# Patient Record
Sex: Female | Born: 1975 | Race: White | Hispanic: No | Marital: Married | Smoking: Former smoker
Health system: Southern US, Community
[De-identification: ages and names within clinical notes are randomized; demographics above are authoritative.]

## PROBLEM LIST (undated history)

## (undated) DIAGNOSIS — F4322 Adjustment disorder with anxiety: Secondary | ICD-10-CM

## (undated) DIAGNOSIS — G44009 Cluster headache syndrome, unspecified, not intractable: Secondary | ICD-10-CM

## (undated) DIAGNOSIS — F410 Panic disorder [episodic paroxysmal anxiety] without agoraphobia: Secondary | ICD-10-CM

## (undated) HISTORY — DX: Adjustment disorder with anxiety: F43.22

## (undated) HISTORY — DX: Panic disorder (episodic paroxysmal anxiety): F41.0

## (undated) HISTORY — PX: FOOT SURGERY: SHX648

## (undated) HISTORY — PX: BREAST LUMPECTOMY: SHX2

## (undated) NOTE — *Deleted (*Deleted)
  Physical Exam  BP 97/73   Pulse (!) 101   Temp 99.7 F (37.6 C) (Oral)   Resp (!) 21   Ht 1.651 m (5\' 5" )   Wt 74.8 kg   SpO2 100%   BMI 27.46 kg/m   Physical Exam  ED Course/Procedures   Clinical Course as of Jul 31 1635  Fri Jul 30, 2020  1606 ED EKG [CM]    Clinical Course User Index [CM] Shirlean Mylar, MD    Procedures  MDM  60 yo female symptoms began Monday, tested covid positive Wednesday. Presented tachycardia and febrile.  Antipyretics and fluid with decreased hr. Patient with chest pain and cta pending. EKG without acute st elevation

---

## 2018-11-20 DIAGNOSIS — I8312 Varicose veins of left lower extremity with inflammation: Secondary | ICD-10-CM | POA: Diagnosis not present

## 2018-11-20 DIAGNOSIS — D485 Neoplasm of uncertain behavior of skin: Secondary | ICD-10-CM | POA: Diagnosis not present

## 2018-11-20 DIAGNOSIS — M79605 Pain in left leg: Secondary | ICD-10-CM | POA: Diagnosis not present

## 2018-11-20 DIAGNOSIS — I8311 Varicose veins of right lower extremity with inflammation: Secondary | ICD-10-CM | POA: Diagnosis not present

## 2018-11-22 DIAGNOSIS — I83813 Varicose veins of bilateral lower extremities with pain: Secondary | ICD-10-CM | POA: Diagnosis not present

## 2019-03-10 DIAGNOSIS — M722 Plantar fascial fibromatosis: Secondary | ICD-10-CM | POA: Diagnosis not present

## 2019-06-12 DIAGNOSIS — M722 Plantar fascial fibromatosis: Secondary | ICD-10-CM | POA: Diagnosis not present

## 2019-06-12 DIAGNOSIS — M6701 Short Achilles tendon (acquired), right ankle: Secondary | ICD-10-CM | POA: Diagnosis not present

## 2019-06-21 DIAGNOSIS — L03039 Cellulitis of unspecified toe: Secondary | ICD-10-CM | POA: Diagnosis not present

## 2019-06-26 DIAGNOSIS — M722 Plantar fascial fibromatosis: Secondary | ICD-10-CM | POA: Diagnosis not present

## 2019-06-26 DIAGNOSIS — L03031 Cellulitis of right toe: Secondary | ICD-10-CM | POA: Diagnosis not present

## 2019-06-26 DIAGNOSIS — M6701 Short Achilles tendon (acquired), right ankle: Secondary | ICD-10-CM | POA: Diagnosis not present

## 2019-07-31 DIAGNOSIS — Z20828 Contact with and (suspected) exposure to other viral communicable diseases: Secondary | ICD-10-CM | POA: Diagnosis not present

## 2019-09-03 DIAGNOSIS — K529 Noninfective gastroenteritis and colitis, unspecified: Secondary | ICD-10-CM | POA: Diagnosis not present

## 2019-09-03 DIAGNOSIS — B349 Viral infection, unspecified: Secondary | ICD-10-CM | POA: Diagnosis not present

## 2019-09-03 DIAGNOSIS — Z20828 Contact with and (suspected) exposure to other viral communicable diseases: Secondary | ICD-10-CM | POA: Diagnosis not present

## 2019-09-22 DIAGNOSIS — F4322 Adjustment disorder with anxiety: Secondary | ICD-10-CM | POA: Diagnosis not present

## 2019-09-22 DIAGNOSIS — R079 Chest pain, unspecified: Secondary | ICD-10-CM | POA: Diagnosis not present

## 2019-09-22 DIAGNOSIS — Z1331 Encounter for screening for depression: Secondary | ICD-10-CM | POA: Diagnosis not present

## 2019-09-22 DIAGNOSIS — Z6826 Body mass index (BMI) 26.0-26.9, adult: Secondary | ICD-10-CM | POA: Diagnosis not present

## 2020-01-10 DIAGNOSIS — Z20822 Contact with and (suspected) exposure to covid-19: Secondary | ICD-10-CM | POA: Diagnosis not present

## 2020-03-05 DIAGNOSIS — G43909 Migraine, unspecified, not intractable, without status migrainosus: Secondary | ICD-10-CM | POA: Diagnosis not present

## 2020-03-05 DIAGNOSIS — Z20822 Contact with and (suspected) exposure to covid-19: Secondary | ICD-10-CM | POA: Diagnosis not present

## 2020-04-26 DIAGNOSIS — Z1231 Encounter for screening mammogram for malignant neoplasm of breast: Secondary | ICD-10-CM | POA: Diagnosis not present

## 2020-04-26 DIAGNOSIS — Z01419 Encounter for gynecological examination (general) (routine) without abnormal findings: Secondary | ICD-10-CM | POA: Diagnosis not present

## 2020-06-01 DIAGNOSIS — Z0000 Encounter for general adult medical examination without abnormal findings: Secondary | ICD-10-CM | POA: Diagnosis not present

## 2020-07-26 DIAGNOSIS — Z20822 Contact with and (suspected) exposure to covid-19: Secondary | ICD-10-CM | POA: Diagnosis not present

## 2020-07-30 ENCOUNTER — Emergency Department (HOSPITAL_COMMUNITY): Payer: BC Managed Care – PPO

## 2020-07-30 ENCOUNTER — Emergency Department (HOSPITAL_COMMUNITY)
Admission: EM | Admit: 2020-07-30 | Discharge: 2020-07-30 | Disposition: A | Discharge status: Home / Self Care | Payer: BC Managed Care – PPO | Attending: Emergency Medicine | Admitting: Emergency Medicine

## 2020-07-30 ENCOUNTER — Encounter (HOSPITAL_COMMUNITY): Payer: Self-pay | Admitting: Emergency Medicine

## 2020-07-30 DIAGNOSIS — U071 COVID-19: Secondary | ICD-10-CM | POA: Insufficient documentation

## 2020-07-30 DIAGNOSIS — J1282 Pneumonia due to coronavirus disease 2019: Secondary | ICD-10-CM | POA: Insufficient documentation

## 2020-07-30 DIAGNOSIS — R079 Chest pain, unspecified: Secondary | ICD-10-CM | POA: Diagnosis not present

## 2020-07-30 DIAGNOSIS — R0789 Other chest pain: Secondary | ICD-10-CM | POA: Diagnosis not present

## 2020-07-30 DIAGNOSIS — R000 Tachycardia, unspecified: Secondary | ICD-10-CM | POA: Diagnosis not present

## 2020-07-30 DIAGNOSIS — J988 Other specified respiratory disorders: Secondary | ICD-10-CM | POA: Diagnosis not present

## 2020-07-30 HISTORY — DX: Cluster headache syndrome, unspecified, not intractable: G44.009

## 2020-07-30 LAB — COMPREHENSIVE METABOLIC PANEL
ALT: 28 U/L (ref 0–44)
AST: 26 U/L (ref 15–41)
Albumin: 4 g/dL (ref 3.5–5.0)
Alkaline Phosphatase: 47 U/L (ref 38–126)
Anion gap: 10 (ref 5–15)
BUN: 6 mg/dL (ref 6–20)
CO2: 25 mmol/L (ref 22–32)
Calcium: 9 mg/dL (ref 8.9–10.3)
Chloride: 101 mmol/L (ref 98–111)
Creatinine, Ser: 0.78 mg/dL (ref 0.44–1.00)
GFR, Estimated: >60 mL/min (ref >60)
Glucose, Bld: 102 mg/dL — ABNORMAL HIGH (ref 70–99)
Potassium: 3.8 mmol/L (ref 3.5–5.1)
Sodium: 136 mmol/L (ref 135–145)
Total Bilirubin: 0.6 mg/dL (ref 0.3–1.2)
Total Protein: 6.9 g/dL (ref 6.5–8.1)

## 2020-07-30 LAB — CBC WITH DIFFERENTIAL/PLATELET
Abs Immature Granulocytes: 0.02 10*3/uL (ref 0.00–0.07)
Basophils Absolute: 0 10*3/uL (ref 0.0–0.1)
Basophils Relative: 0 %
Eosinophils Absolute: 0 10*3/uL (ref 0.0–0.5)
Eosinophils Relative: 0 %
HCT: 44 % (ref 36.0–46.0)
Hemoglobin: 14.7 g/dL (ref 12.0–15.0)
Immature Granulocytes: 0 %
Lymphocytes Relative: 17 %
Lymphs Abs: 0.9 10*3/uL (ref 0.7–4.0)
MCH: 31.3 pg (ref 26.0–34.0)
MCHC: 33.4 g/dL (ref 30.0–36.0)
MCV: 93.8 fL (ref 80.0–100.0)
Monocytes Absolute: 0.6 10*3/uL (ref 0.1–1.0)
Monocytes Relative: 10 %
Neutro Abs: 3.9 10*3/uL (ref 1.7–7.7)
Neutrophils Relative %: 73 %
Platelets: 162 10*3/uL (ref 150–400)
RBC: 4.69 MIL/uL (ref 3.87–5.11)
RDW: 11.9 % (ref 11.5–15.5)
WBC: 5.4 10*3/uL (ref 4.0–10.5)
nRBC: 0 % (ref 0.0–0.2)

## 2020-07-30 LAB — LACTIC ACID, PLASMA: Lactic Acid, Venous: 0.9 mmol/L (ref 0.5–1.9)

## 2020-07-30 LAB — I-STAT BETA HCG BLOOD, ED (MC, WL, AP ONLY): I-stat hCG, quantitative: <5 m[IU]/mL (ref <5)

## 2020-07-30 MED ORDER — IOHEXOL 350 MG/ML SOLN
100.0000 mL | Freq: Once | INTRAVENOUS | Status: AC | PRN
  Administered 2020-07-30: 66 mL via INTRAVENOUS

## 2020-07-30 MED ORDER — ACETAMINOPHEN 325 MG PO TABS
650.0000 mg | ORAL_TABLET | Freq: Once | ORAL | Status: AC | PRN
  Administered 2020-07-30: 650 mg via ORAL
  Filled 2020-07-30: qty 2

## 2020-07-30 MED ORDER — SODIUM CHLORIDE 0.9 % BOLUS PEDS
1000.0000 mL | Freq: Once | INTRAVENOUS | Status: AC
  Administered 2020-07-30: 1000 mL via INTRAVENOUS

## 2020-07-30 MED ORDER — IBUPROFEN 400 MG PO TABS
600.0000 mg | ORAL_TABLET | Freq: Once | ORAL | Status: AC
  Administered 2020-07-30: 600 mg via ORAL
  Filled 2020-07-30: qty 1

## 2020-07-30 NOTE — ED Triage Notes (Addendum)
Pt states diagnosed with covid on wed, O2 sat has remaiend 94 and greater at home, c/o cough, n/v/d at home, saw pcp who was concerned about patient's elevated HR (120s in triage w/fever). pcp Attempted to get patient in for outpatient infusion but was unable to get her an appt and did not want pt to wait 6+ hours-per patient. A/ox4, nad.

## 2020-07-30 NOTE — Discharge Instructions (Addendum)
You were seen today for chest pain and shortness of breath in association with COVID-19 diagnosis. You do not have any blood clots in your lungs and no problems with your heart. Since your vital signs are stable, we recommend you are safe to go home with close follow up with your primary care provider. If you find you are having trouble breathing, cannot keep down fluids for 24 hours, have swelling/heat/redness in one leg, or chest pain that does not improve with tylenol or ibuprofen, please return to the emergency department for evaluation.  For the monoclonal antibody infusions, the infusion center at [facility] will call you to schedule treatment. You can expect a phone call today or tomorrow.

## 2020-07-30 NOTE — ED Provider Notes (Signed)
[facility] EMERGENCY DEPARTMENT Provider Note   CSN: [id number] Arrival date & time: 07/30/20  1016     History Chief Complaint  Patient presents with   Covid Positive    Kari [name] is a 44 y.o. female.  44 year old woman presenting with chest pain, tachycardia, shortness of breath, recent Covid diagnosis.  Patient began feeling ill with headache, cough on Monday.  She has a positive for Covid-19 on Wednesday.  She has had fever, cough, headache, shortness of breath, chest pain, tachycardia, nausea, vomiting, diarrhea today.  She saw her primary care provider today and due to her chest pain, tachycardia they sent her to the ED to be evaluated.  Patient does not have significant past medical history.  Reports migraines, spider veins in her lower extremities.  She has no unilateral redness, heat, swelling in her extremities.  In triage patient had tachycardia to 114, now improved to 80s.  Satting well between 97 and 100% on room air.  NKDA, only medications are sumatriptans for migraines.        Past Medical History:  Diagnosis Date   Migraine-cluster headache syndrome     There are no problems to display for this patient.   History reviewed. No pertinent surgical history.   OB History   No obstetric history on file.     No family history on file.  Social History   Tobacco Use   Smoking status: Not on file  Substance Use Topics   Alcohol use: Not on file   Drug use: Not on file    Home Medications Prior to Admission medications   Not on File    Allergies    Patient has no known allergies.  Review of Systems   Review of Systems  Constitutional: Positive for activity change, appetite change, chills, fatigue and fever.  HENT: Positive for congestion and rhinorrhea.   Respiratory: Positive for cough and shortness of breath. Negative for wheezing.   Cardiovascular: Positive for chest pain. Negative for palpitations and leg  swelling.  Gastrointestinal: Positive for diarrhea, nausea and vomiting. Negative for abdominal pain and constipation.  Musculoskeletal: Positive for myalgias.  Neurological: Positive for headaches.  All other systems reviewed and are negative.   Physical Exam Updated Vital Signs BP 97/73    Pulse (!) 101    Temp 99.7 F (37.6 C) (Oral)    Resp (!) 21    Ht 5\' 5"  (1.651 m)    Wt 74.8 kg    SpO2 100%    BMI 27.46 kg/m   Physical Exam Vitals and nursing note reviewed.  Constitutional:      General: She is not in acute distress.    Appearance: She is normal weight. She is ill-appearing. She is not toxic-appearing or diaphoretic.  HENT:     Head: Normocephalic and atraumatic.     Mouth/Throat:     Mouth: Mucous membranes are moist.     Pharynx: Oropharynx is clear.  Eyes:     Conjunctiva/sclera: Conjunctivae normal.     Pupils: Pupils are equal, round, and reactive to light.  Cardiovascular:     Rate and Rhythm: Normal rate and regular rhythm.     Pulses: Normal pulses.     Heart sounds: Normal heart sounds.  Pulmonary:     Effort: Pulmonary effort is normal.     Breath sounds: Normal breath sounds.  Abdominal:     General: Abdomen is flat. Bowel sounds are normal.     Palpations:  Abdomen is soft.     Tenderness: There is no abdominal tenderness.  Musculoskeletal:     Right lower leg: No edema.     Left lower leg: No edema.     Comments: No leg edema, erythema or warmth  Skin:    General: Skin is warm and dry.  Neurological:     General: No focal deficit present.     Mental Status: She is alert and oriented to person, place, and time. Mental status is at baseline.  Psychiatric:        Behavior: Behavior normal.     Comments: Tearful due to feeling so ill     ED Results / Procedures / Treatments   Labs (all labs ordered are listed, but only abnormal results are displayed) Labs Reviewed  COMPREHENSIVE METABOLIC PANEL - Abnormal; Notable for the following components:        Result Value   Glucose, Bld 102 (*)    All other components within normal limits  LACTIC ACID, PLASMA  CBC WITH DIFFERENTIAL/PLATELET  LACTIC ACID, PLASMA  URINALYSIS, ROUTINE W REFLEX MICROSCOPIC  I-STAT BETA HCG BLOOD, ED ([facility], [facility], [facility] ONLY)    EKG EKG Interpretation  Date/Time:  Friday July 30 2020 14:29:46 EDT Ventricular Rate:  85 PR Interval:    QRS Duration: 107 QT Interval:  371 QTC Calculation: 442 R Axis:   64 Text Interpretation: Sinus rhythm Borderline repolarization abnormality No old tracing to compare Reconfirmed by Margarita Grizzle [phone]) on 07/30/2020 4:39:00 PM   Radiology CT Angio Chest PE W/Cm &/Or Wo Cm  Result Date: 07/30/2020 CLINICAL DATA:  COVID positive EXAM: CT ANGIOGRAPHY CHEST WITH CONTRAST TECHNIQUE: Multidetector CT imaging of the chest was performed using the standard protocol during bolus administration of intravenous contrast. Multiplanar CT image reconstructions and MIPs were obtained to evaluate the vascular anatomy. CONTRAST:  51mL OMNIPAQUE IOHEXOL 350 MG/ML SOLN COMPARISON:  Radiograph same day FINDINGS: Cardiovascular: There is a optimal opacification of the pulmonary arteries. There is no central,segmental, or subsegmental filling defects within the pulmonary arteries. The heart is normal in size. No pericardial effusion or thickening. No evidence right heart strain. There is normal three-vessel brachiocephalic anatomy without proximal stenosis. The thoracic aorta is normal in appearance. Mediastinum/Nodes: No hilar, mediastinal, or axillary adenopathy. Thyroid gland, trachea, and esophagus demonstrate no significant findings. Lungs/Pleura: Multifocal patchy airspace opacities are seen throughout both lungs, predominantly within both lung bases. No pleural effusion or pneumothorax is seen. Upper Abdomen: No acute abnormalities present in the visualized portions of the upper abdomen. Musculoskeletal: No chest wall abnormality. No acute or  significant osseous findings. Review of the MIP images confirms the above findings. IMPRESSION: No central, segmental, or subsegmental pulmonary embolism. Multifocal airspace opacities, predominantly within both lower lungs, consistent with atypical viral pneumonia. Electronically Signed   By: Jonna Clark M.D.   On: 07/30/2020 16:48   DG Chest Portable 1 View  Result Date: 07/30/2020 CLINICAL DATA:  COVID positive EXAM: PORTABLE CHEST 1 VIEW COMPARISON:  None. FINDINGS: The heart size and mediastinal contours are within normal limits. Both lungs are clear. The visualized skeletal structures are unremarkable. IMPRESSION: No active disease. Electronically Signed   By: Marlan Palau M.D.   On: 07/30/2020 11:18    Procedures Procedures (including critical care time)  Medications Ordered in ED Medications  acetaminophen (TYLENOL) tablet 650 mg (650 mg Oral Given 07/30/20 1033)  ibuprofen (ADVIL) tablet 600 mg (600 mg Oral Given 07/30/20 1430)  0.9% NaCl bolus PEDS (  1,000 mLs Intravenous New Bag/Given 07/30/20 1439)  iohexol (OMNIPAQUE) 350 MG/ML injection 100 mL (66 mLs Intravenous Contrast Given 07/30/20 1642)    ED Course  I have reviewed the triage vital signs and the nursing notes.  Pertinent labs & imaging results that were available during my care of the patient were reviewed by me and considered in my medical decision making (see chart for details).  Clinical Course as of Jul 30 1657  Fri Jul 30, 2020  1606 ED EKG [CM]    Clinical Course User Index [CM] Shirlean Mylar, MD   MDM Rules/Calculators/A&P                          44 yo woman presenting with chest pain and SOB in setting of COVID-19 diagnosis. She is not vaccinated. Well's Criteria 4.5, moderate likelihood of PE. Will obtain EKG, and depending on results may obtain troponin. CBC and CMP WNL. Will order CTA chest to r/o PE, pericarditis. Patient reports unable to tolerate fluids by mouth with many episodes of nausea  and vomiting, will treat with IVF and reassess.   Vital signs stable. CTA negative for PE and any cardiac abnormalities. Patient ok for discharge home with strict return precautions.  Patient is interested in MAB infusion, meets criteria for BMI >25. Messaged [facility] group who will contact patient for infusion.  Final Clinical Impression(s) / ED Diagnoses Final diagnoses:  Pneumonia due to COVID-19 virus    Rx / DC Orders ED Discharge Orders    None       Shirlean Mylar, MD 07/30/20 1708    Vanetta Mulders, MD 07/30/20 [phone]

## 2020-07-31 ENCOUNTER — Telehealth: Payer: Self-pay | Admitting: Unknown Physician Specialty

## 2020-07-31 ENCOUNTER — Other Ambulatory Visit: Payer: Self-pay | Admitting: Unknown Physician Specialty

## 2020-07-31 ENCOUNTER — Telehealth: Payer: Self-pay | Admitting: Physician Assistant

## 2020-07-31 ENCOUNTER — Telehealth (HOSPITAL_COMMUNITY): Payer: Self-pay | Admitting: Emergency Medicine

## 2020-07-31 DIAGNOSIS — U071 COVID-19: Secondary | ICD-10-CM

## 2020-07-31 DIAGNOSIS — E663 Overweight: Secondary | ICD-10-CM

## 2020-07-31 NOTE — Telephone Encounter (Signed)
Called to Discuss with patient about Covid symptoms and the use of the monoclonal antibody infusion for those with mild to moderate Covid symptoms and at a high risk of hospitalization.     Pt appears to qualify for this infusion due to co-morbid conditions and/or a member of an at-risk group in accordance with the FDA Emergency Use Authorization.    Unable to reach pt. Left voice mail to call back.   

## 2020-07-31 NOTE — Telephone Encounter (Signed)
I connected by phone with Kari [name] on 07/31/2020 at 4:25 PM to discuss the potential use of a new treatment for mild to moderate COVID-19 viral infection in non-hospitalized patients.  This patient is a 44 y.o. female that meets the FDA criteria for Emergency Use Authorization of COVID monoclonal antibody casirivimab/imdevimab or bamlanivimab/eteseviamb.  Has a (+) direct SARS-CoV-2 viral test result  Has mild or moderate COVID-19   Is NOT hospitalized due to COVID-19  Is within 10 days of symptom onset  Has at least one of the high risk factor(s) for progression to severe COVID-19 and/or hospitalization as defined in EUA.  Specific high risk criteria : BMI > 25   I have spoken and communicated the following to the patient or parent/caregiver regarding COVID monoclonal antibody treatment:  1. FDA has authorized the emergency use for the treatment of mild to moderate COVID-19 in adults and pediatric patients with positive results of direct SARS-CoV-2 viral testing who are 88 years of age and older weighing at least 40 kg, and who are at high risk for progressing to severe COVID-19 and/or hospitalization.  2. The significant known and potential risks and benefits of COVID monoclonal antibody, and the extent to which such potential risks and benefits are unknown.  3. Information on available alternative treatments and the risks and benefits of those alternatives, including clinical trials.  4. Patients treated with COVID monoclonal antibody should continue to self-isolate and use infection control measures (e.g., wear mask, isolate, social distance, avoid sharing personal items, clean and disinfect "high touch" surfaces, and frequent handwashing) according to CDC guidelines.   5. The patient or parent/caregiver has the option to accept or refuse COVID monoclonal antibody treatment.  After reviewing this information with the patient, the patient has agreed to receive one of the  available covid 19 monoclonal antibodies and will be provided an appropriate fact sheet prior to infusion. [name], NP 07/31/2020 4:25 PM  Sx onset 10/10

## 2020-07-31 NOTE — Telephone Encounter (Signed)
Called pt and explained possible monoclonal antibody treatment.    Sx started 10/11. Tested positive 10/13 with home test. Provider sent pt to [facility] ED yesterday where she was dx with pneumonia. Sx include headaches, diarrhea, vomiting, fever, couch, chest congestion, chest tightness, body aches, fatigue, and dizziness. Qualifying risk factors include second hand smoke,  BMI 27, and vascular disease. Pt interested in tx. Informed pt an APP will call back to schedule an appointment.

## 2020-08-01 ENCOUNTER — Ambulatory Visit (HOSPITAL_COMMUNITY)
Admission: RE | Admit: 2020-08-01 | Discharge: 2020-08-01 | Disposition: A | Discharge status: Home / Self Care | Payer: BC Managed Care – PPO | Source: Ambulatory Visit | Attending: Pulmonary Disease | Admitting: Pulmonary Disease

## 2020-08-01 DIAGNOSIS — U071 COVID-19: Secondary | ICD-10-CM | POA: Insufficient documentation

## 2020-08-01 DIAGNOSIS — E663 Overweight: Secondary | ICD-10-CM | POA: Diagnosis not present

## 2020-08-01 MED ORDER — METHYLPREDNISOLONE SODIUM SUCC 125 MG IJ SOLR: 125.0000 mg | Freq: Once | INTRAMUSCULAR | Status: DC | PRN

## 2020-08-01 MED ORDER — FAMOTIDINE IN NACL 20-0.9 MG/50ML-% IV SOLN: 20.0000 mg | Freq: Once | INTRAVENOUS | Status: DC | PRN

## 2020-08-01 MED ORDER — ALBUTEROL SULFATE HFA 108 (90 BASE) MCG/ACT IN AERS: 2.0000 | INHALATION_SPRAY | Freq: Once | RESPIRATORY_TRACT | Status: DC | PRN

## 2020-08-01 MED ORDER — SODIUM CHLORIDE 0.9 % IV SOLN: INTRAVENOUS | Status: DC | PRN

## 2020-08-01 MED ORDER — SODIUM CHLORIDE 0.9 % IV SOLN: Freq: Once | INTRAVENOUS | Status: AC

## 2020-08-01 MED ORDER — EPINEPHRINE 0.3 MG/0.3ML IJ SOAJ: 0.3000 mg | Freq: Once | INTRAMUSCULAR | Status: DC | PRN

## 2020-08-01 MED ORDER — DIPHENHYDRAMINE HCL 50 MG/ML IJ SOLN: 50.0000 mg | Freq: Once | INTRAMUSCULAR | Status: DC | PRN

## 2020-08-01 NOTE — Progress Notes (Signed)
  Diagnosis: COVID-19  Physician: Dr. Wright  Procedure: Covid Infusion Clinic Med: bamlanivimab\etesevimab infusion - Provided patient with bamlanimivab\etesevimab fact sheet for patients, parents and caregivers prior to infusion.  Complications: No immediate complications noted.  Discharge: Discharged home   Veeda Virgo E Leianne Callins 08/01/2020   

## 2020-08-01 NOTE — Discharge Instructions (Signed)

## 2020-08-03 DIAGNOSIS — R053 Chronic cough: Secondary | ICD-10-CM | POA: Diagnosis not present

## 2020-08-17 DIAGNOSIS — Z6825 Body mass index (BMI) 25.0-25.9, adult: Secondary | ICD-10-CM | POA: Diagnosis not present

## 2020-08-17 DIAGNOSIS — Z8616 Personal history of COVID-19: Secondary | ICD-10-CM | POA: Diagnosis not present

## 2020-08-30 DIAGNOSIS — I83813 Varicose veins of bilateral lower extremities with pain: Secondary | ICD-10-CM | POA: Diagnosis not present

## 2020-09-16 DIAGNOSIS — Z6826 Body mass index (BMI) 26.0-26.9, adult: Secondary | ICD-10-CM | POA: Diagnosis not present

## 2020-09-16 DIAGNOSIS — R202 Paresthesia of skin: Secondary | ICD-10-CM | POA: Diagnosis not present

## 2020-10-06 DIAGNOSIS — Z1231 Encounter for screening mammogram for malignant neoplasm of breast: Secondary | ICD-10-CM | POA: Diagnosis not present

## 2021-02-21 DIAGNOSIS — R5382 Chronic fatigue, unspecified: Secondary | ICD-10-CM | POA: Diagnosis not present

## 2021-02-21 DIAGNOSIS — R0602 Shortness of breath: Secondary | ICD-10-CM | POA: Diagnosis not present

## 2021-03-21 DIAGNOSIS — R5382 Chronic fatigue, unspecified: Secondary | ICD-10-CM | POA: Diagnosis not present

## 2021-03-21 DIAGNOSIS — J029 Acute pharyngitis, unspecified: Secondary | ICD-10-CM | POA: Diagnosis not present

## 2021-12-29 DIAGNOSIS — F4322 Adjustment disorder with anxiety: Secondary | ICD-10-CM | POA: Diagnosis not present

## 2021-12-29 DIAGNOSIS — Z6827 Body mass index (BMI) 27.0-27.9, adult: Secondary | ICD-10-CM | POA: Diagnosis not present

## 2022-02-02 DIAGNOSIS — M6701 Short Achilles tendon (acquired), right ankle: Secondary | ICD-10-CM | POA: Diagnosis not present

## 2022-02-02 DIAGNOSIS — M6702 Short Achilles tendon (acquired), left ankle: Secondary | ICD-10-CM | POA: Diagnosis not present

## 2022-02-02 DIAGNOSIS — M722 Plantar fascial fibromatosis: Secondary | ICD-10-CM | POA: Diagnosis not present

## 2022-02-15 ENCOUNTER — Other Ambulatory Visit: Payer: Self-pay | Admitting: *Deleted

## 2022-02-15 ENCOUNTER — Encounter: Payer: Self-pay | Admitting: *Deleted

## 2022-02-16 ENCOUNTER — Ambulatory Visit (INDEPENDENT_AMBULATORY_CARE_PROVIDER_SITE_OTHER): Payer: BC Managed Care – PPO | Admitting: Psychiatry

## 2022-02-16 ENCOUNTER — Telehealth: Payer: Self-pay | Admitting: *Deleted

## 2022-02-16 ENCOUNTER — Encounter: Payer: Self-pay | Admitting: Psychiatry

## 2022-02-16 VITALS — BP 122/71 | HR 68 | Ht 66.0 in | Wt 177.0 lb

## 2022-02-16 DIAGNOSIS — G43119 Migraine with aura, intractable, without status migrainosus: Secondary | ICD-10-CM | POA: Diagnosis not present

## 2022-02-16 MED ORDER — METHYLPREDNISOLONE 4 MG PO TBPK: ORAL_TABLET | ORAL | 0 refills | Status: DC

## 2022-02-16 MED ORDER — UBRELVY 100 MG PO TABS: 100.0000 mg | ORAL_TABLET | ORAL | 3 refills | Status: DC | PRN

## 2022-02-16 MED ORDER — DULOXETINE HCL 20 MG PO CPEP: 20.0000 mg | ORAL_CAPSULE | Freq: Every day | ORAL | 3 refills | Status: DC

## 2022-02-16 NOTE — Patient Instructions (Signed)
Try to limit over the counter medications to 2 days per week to avoid rebound headaches. I will prescribe a short course of steroids to help reduce your headaches while you reduce over the counter use ?Start Cymbalta 20 mg daily for headache prevention ?Start Ubrelvy 100 mg as needed for migraine. Take one pill at the onset of migraine, can repeat a dose in 2 hours if headache persists ?

## 2022-02-16 NOTE — Progress Notes (Signed)
? ?Referring:  ?[facility] ?[address] ?[place],  [place] [address] ? ?PCP: ?[facility] ? ?Neurology was asked to evaluate Kari [name], a 46 year old female for a chief complaint of headaches.  Our recommendations of care will be communicated by shared medical record.   ? ?CC:  headaches ? ?History provided from self ? ?HPI:  ?Medical co-morbidities: panic attacks ? ?The patient presents for evaluation of headaches which began at age 19. She currently has daily headaches with more severe migraines 3 times per month. Feels her daily headaches have been slightly less severe in the past 6 months. Migraines are described as throbbing pain with associated photophobia and phonophobia. They can last up to 3 days at a time. She currently takes Imitrex nasal spray as needed for migraines, which only works some of the time. Has also tried Maxalt and Frovatriptan, but these also worked inconsistently. Also takes Tylenol and Advil every other day.  ? ?She previously took [employer] for migraine prevention which worked very well for her. Unfortunately insurance stopped covering it and she is now not on any preventive medication. ? 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 Photophobia: yes ? Phonophobia: yes ?Worse with activity?: yes ?Duration of headaches: up to 3 days ? ?Headache days per month: 30 ?Headache free days per month: 0 ? ?Current Treatment: ?Abortive ?Imitrex ? ?Preventative ?None ? ?Prior Therapies                                 ?Emgality - helped but insurance stopped covering it ?Topamax - side effects ?Imitrex nasal solution ?Frovatriptan  ?Maxalt ? ? ?LABS: ?CBC ?   ?Component Value Date/Time  ? WBC 5.4 07/30/2020 1027  ? RBC 4.69 07/30/2020 1027  ? HGB 14.7 07/30/2020 1027  ? HCT 44.0 07/30/2020 1027  ? PLT 162 07/30/2020  1027  ? MCV 93.8 07/30/2020 1027  ? MCH 31.3 07/30/2020 1027  ? MCHC 33.4 07/30/2020 1027  ? RDW 11.9 07/30/2020 1027  ? LYMPHSABS 0.9 07/30/2020 1027  ? MONOABS 0.6 07/30/2020 1027  ? EOSABS 0.0 07/30/2020 1027  ? BASOSABS 0.0 07/30/2020 1027  ? ? ?  Latest Ref Rng & Units 07/30/2020  ? 10:27 AM  ?CMP  ?Glucose 70 - 99 mg/dL 621    ?BUN 6 - 20 mg/dL 6    ?Creatinine 0.44 - 1.00 mg/dL 3.08    ?Sodium 135 - 145 mmol/L 136    ?Potassium 3.5 - 5.1 mmol/L 3.8    ?Chloride 98 - 111 mmol/L 101    ?CO2 22 - 32 mmol/L 25    ?Calcium 8.9 - 10.3 mg/dL 9.0    ?Total Protein 6.5 - 8.1 g/dL 6.9    ?Total Bilirubin 0.3 - 1.2 mg/dL 0.6    ?Alkaline Phos 38 - 126 U/L 47    ?AST 15 - 41 U/L 26    ?ALT 0 - 44 U/L 28    ? ? ? ?IMAGING:  ?MRI in 2008 reportedly normal per patient ? ?Current Outpatient Medications on File Prior to Visit  ?Medication Sig Dispense Refill  ? ALPRAZolam (XANAX) 0.5 MG tablet Take 1 tablet by mouth 2 (two) times daily as needed.    ? meloxicam (MOBIC) 15 MG tablet Take 15 mg by mouth as needed.    ? ?  No current facility-administered medications on file prior to visit.  ? ? ? ?Allergies: ?Allergies  ?Allergen Reactions  ? Topiramate   ?  Other reaction(s): numbness, fogetfullness  ? ? ?Family History: ?Migraine or other headaches in the family:  son, dad ?Aneurysms in a first degree relative:  none ?Brain tumors in the family:  none ?Other neurological illness in the family:   stroke ? ?Past Medical History: ?Past Medical History:  ?Diagnosis Date  ? Adjustment disorder with anxious mood   ? Migraine-cluster headache syndrome   ? Panic attacks   ? ? ?Past Surgical History ?Past Surgical History:  ?Procedure Laterality Date  ? BREAST LUMPECTOMY    ? benign  ? FOOT SURGERY    ? bunionectmy  ? ? ?Social History: ?Social History  ? ?Tobacco Use  ? Smoking status: Former  ?  Years: 1.00  ?  Types: Cigarettes  ? Smokeless tobacco: Never  ?Substance Use Topics  ? Alcohol use: Yes  ?  Comment: socially  ? Drug use:  Never  ? ? ?ROS: ?Negative for fevers, chills. Positive for headaches. All other systems reviewed and negative unless stated otherwise in HPI. ? ? ?Physical Exam:  ? ?Vital Signs: ?BP 122/71   Pulse 68   Ht 5\' 6"  (1.676 m)   Wt 177 lb (80.3 kg)   BMI 28.57 kg/m?  ?GENERAL: well appearing,in no acute distress,alert ?SKIN:  Color, texture, turgor normal. No rashes or lesions ?HEAD:  Normocephalic/atraumatic. ?CV:  RRR ?RESP: Normal respiratory effort ?MSK: no tenderness to palpation over occiput, neck, or shoulders ? ?NEUROLOGICAL: ?Mental Status: Alert, oriented to person, place and time,Follows commands ?Cranial Nerves: PERRL, visual fields intact to confrontation, extraocular movements intact, facial sensation intact, no facial droop or ptosis, hearing grossly intact, no dysarthria ?Motor: muscle strength 5/5 both upper and lower extremities,no drift, normal tone ?Reflexes: 2+ throughout ?Sensation: intact to light touch all 4 extremities ?Coordination: Finger-to- nose-finger intact bilaterally ?Gait: normal-based ? ? ?IMPRESSION: ?46 year old female with a history of panic attacks who presents for evaluation of migraines. Suspect she also has a component of medication overuse headache in the setting of frequent OTC use. Counseled on limiting OTC use to 2 days per week. Will prescribe medrol dosepak to help her wean OTCs. Will start Cymbalta for migraine prevention. She has tried multiple triptans without success. Will start Ubrelvy for migraine rescue. ? ?PLAN: ?-Counseled on limiting OTC use to 2 days per week. Medrol dosepak prescribed to help wean OTCs ?-Prevention: Start Cymbalta 20 mg daily ?-Rescue: Start Ubrelvy 100 mg PRN ?-next steps: Consider Emgality ? ?I spent a total of 39 minutes chart reviewing and counseling the patient. Headache education was done. Discussed treatment options including preventive and acute medications. Discussed medication overuse headache and to limit use of acute treatments  to no more than 2 days/week or 10 days/month. Discussed medication side effects, adverse reactions and drug interactions. Written educational materials and patient instructions outlining all of the above were given. ? ?Follow-up: 4 months ? ? ?49, MD ?02/16/2022   ?10:13 AM ? ? ?

## 2022-02-16 NOTE — Telephone Encounter (Signed)
Bernita Raisin PA, Key: [id number], [id number]. Your information has been sent to [employer]. ?

## 2022-02-21 NOTE — Telephone Encounter (Signed)
Bernita Raisin Approved  today ?PA Case: [id number], Status: Approved, Coverage Starts on: 02/16/2022 12:00 AM, Coverage Ends on: 02/17/2023 12:00 AM.  ?Questions? Contact [phone]. ?

## 2022-03-07 DIAGNOSIS — M6702 Short Achilles tendon (acquired), left ankle: Secondary | ICD-10-CM | POA: Diagnosis not present

## 2022-03-07 DIAGNOSIS — M6701 Short Achilles tendon (acquired), right ankle: Secondary | ICD-10-CM | POA: Diagnosis not present

## 2022-03-07 DIAGNOSIS — M722 Plantar fascial fibromatosis: Secondary | ICD-10-CM | POA: Diagnosis not present

## 2022-03-15 IMAGING — CR DG CHEST 1V PORT
1 series · 1 of 1 positions shown · non-contrast
Comparison: None.

CLINICAL DATA: COVID positive

EXAM:
PORTABLE CHEST 1 VIEW

[AP]
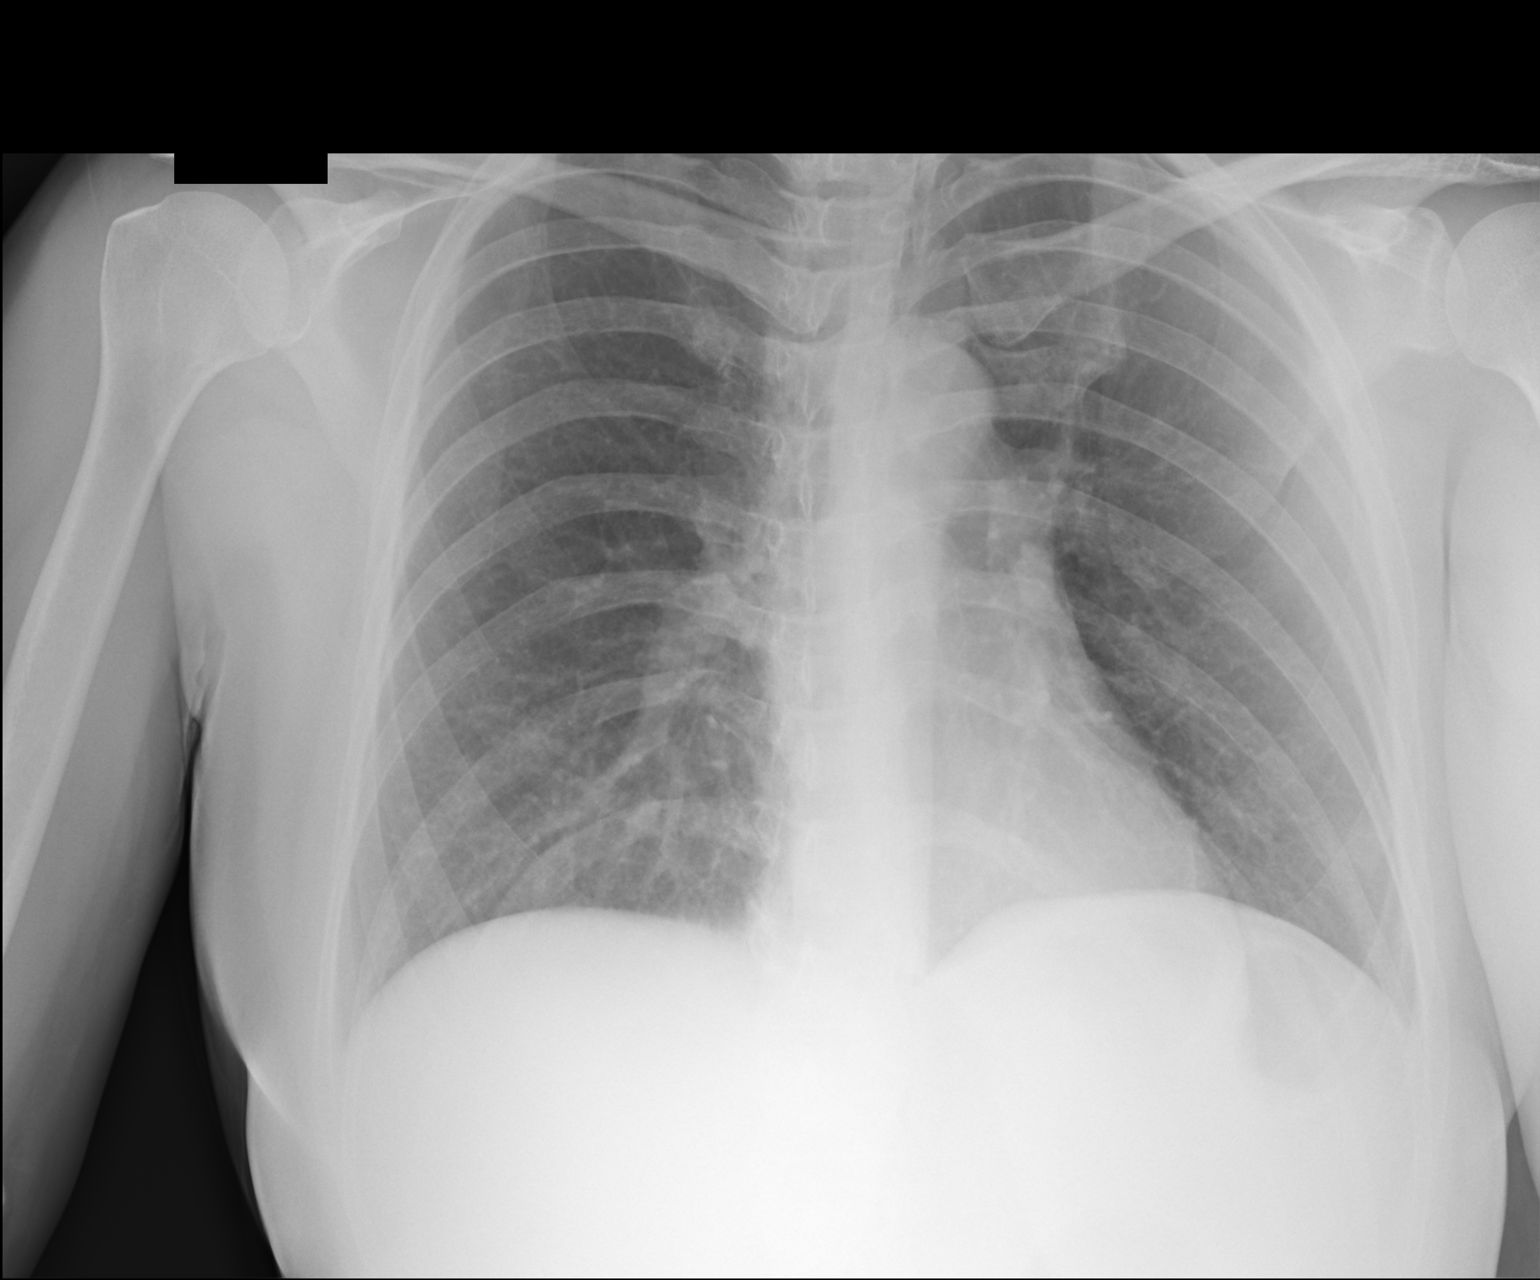

[1 of 1 positions shown; findings below may reference images not displayed]

FINDINGS: The heart size and mediastinal contours are within normal limits.
Both lungs are clear. The visualized skeletal structures are
unremarkable.
IMPRESSION: No active disease.

## 2022-04-03 DIAGNOSIS — Z13220 Encounter for screening for lipoid disorders: Secondary | ICD-10-CM | POA: Diagnosis not present

## 2022-04-03 DIAGNOSIS — Z118 Encounter for screening for other infectious and parasitic diseases: Secondary | ICD-10-CM | POA: Diagnosis not present

## 2022-04-03 DIAGNOSIS — Z1329 Encounter for screening for other suspected endocrine disorder: Secondary | ICD-10-CM | POA: Diagnosis not present

## 2022-04-03 DIAGNOSIS — Z975 Presence of (intrauterine) contraceptive device: Secondary | ICD-10-CM | POA: Diagnosis not present

## 2022-04-03 DIAGNOSIS — Z130 Encounter for screening for diseases of the blood and blood-forming organs and certain disorders involving the immune mechanism: Secondary | ICD-10-CM | POA: Diagnosis not present

## 2022-04-03 DIAGNOSIS — N911 Secondary amenorrhea: Secondary | ICD-10-CM | POA: Diagnosis not present

## 2022-04-03 DIAGNOSIS — Z01419 Encounter for gynecological examination (general) (routine) without abnormal findings: Secondary | ICD-10-CM | POA: Diagnosis not present

## 2022-04-05 DIAGNOSIS — D171 Benign lipomatous neoplasm of skin and subcutaneous tissue of trunk: Secondary | ICD-10-CM | POA: Diagnosis not present

## 2022-04-05 DIAGNOSIS — Z6828 Body mass index (BMI) 28.0-28.9, adult: Secondary | ICD-10-CM | POA: Diagnosis not present

## 2022-04-20 DIAGNOSIS — M722 Plantar fascial fibromatosis: Secondary | ICD-10-CM | POA: Diagnosis not present

## 2022-04-20 DIAGNOSIS — M6701 Short Achilles tendon (acquired), right ankle: Secondary | ICD-10-CM | POA: Diagnosis not present

## 2022-04-20 DIAGNOSIS — M6702 Short Achilles tendon (acquired), left ankle: Secondary | ICD-10-CM | POA: Diagnosis not present

## 2022-04-28 DIAGNOSIS — Z3202 Encounter for pregnancy test, result negative: Secondary | ICD-10-CM | POA: Diagnosis not present

## 2022-04-28 DIAGNOSIS — Z30433 Encounter for removal and reinsertion of intrauterine contraceptive device: Secondary | ICD-10-CM | POA: Diagnosis not present

## 2022-05-22 DIAGNOSIS — Z30431 Encounter for routine checking of intrauterine contraceptive device: Secondary | ICD-10-CM | POA: Diagnosis not present

## 2022-05-30 DIAGNOSIS — M722 Plantar fascial fibromatosis: Secondary | ICD-10-CM | POA: Diagnosis not present

## 2022-05-30 DIAGNOSIS — M6702 Short Achilles tendon (acquired), left ankle: Secondary | ICD-10-CM | POA: Diagnosis not present

## 2022-05-30 DIAGNOSIS — M6701 Short Achilles tendon (acquired), right ankle: Secondary | ICD-10-CM | POA: Diagnosis not present

## 2022-07-04 NOTE — Progress Notes (Unsigned)
   CC:  headaches  Follow-up Visit  Last visit: 02/16/22  Brief HPI: 46 year old female with a history of panic attacks who follows in clinic for migraines.  At her last visit she was counseled on limiting OTC use to avoid rebound headaches. She was started on Cymbalta for migraine prevention and Ubrelvy for rescue. Interval History: ***   Headache days per month: *** Headache free days per month: *** Headache severity: ***  Current Headache Regimen: Preventative: *** Abortive: ***  # of doses of abortive medications per month: ***  Prior Therapies                                  Emgality - helped but insurance stopped covering it Topamax - side effects Imitrex nasal solution Frovatriptan  Maxalt  Physical Exam:   Vital Signs: There were no vitals taken for this visit. GENERAL:  well appearing, in no acute distress, alert  SKIN:  Color, texture, turgor normal. No rashes or lesions HEAD:  Normocephalic/atraumatic. RESP: normal respiratory effort MSK:  No gross joint deformities.   NEUROLOGICAL: Mental Status: Alert, oriented to person, place and time, Follows commands, and Speech fluent and appropriate. Cranial Nerves: PERRL, face symmetric, no dysarthria, hearing grossly intact Motor: moves all extremities equally Gait: normal-based.  IMPRESSION: ***  PLAN: ***   Follow-up: ***  I spent a total of *** minutes on the date of the service. Headache education was done. Discussed lifestyle modification including increased oral hydration, decreased caffeine, exercise and stress management. Discussed treatment options including preventive and acute medications, natural supplements, and infusion therapy. Discussed medication overuse headache and to limit use of acute treatments to no more than 2 days/week or 10 days/month. Discussed medication side effects, adverse reactions and drug interactions. Written educational materials and patient instructions outlining all of  the above were given.  Genia Harold, MD

## 2022-07-05 ENCOUNTER — Ambulatory Visit (INDEPENDENT_AMBULATORY_CARE_PROVIDER_SITE_OTHER): Payer: BC Managed Care – PPO | Admitting: Psychiatry

## 2022-07-05 VITALS — BP 113/65 | HR 74 | Ht 66.0 in | Wt 183.2 lb

## 2022-07-05 DIAGNOSIS — G43119 Migraine with aura, intractable, without status migrainosus: Secondary | ICD-10-CM | POA: Diagnosis not present

## 2022-07-05 MED ORDER — EMGALITY 120 MG/ML ~~LOC~~ SOAJ: 120.0000 mg | SUBCUTANEOUS | 6 refills | Status: DC

## 2022-07-05 MED ORDER — EMGALITY 120 MG/ML ~~LOC~~ SOAJ: 240.0000 mg | Freq: Once | SUBCUTANEOUS | 0 refills | Status: AC

## 2022-07-05 NOTE — Patient Instructions (Signed)
Stop Cymbalta.  Start Emgality for migraine prevention

## 2022-07-11 ENCOUNTER — Encounter: Payer: Self-pay | Admitting: *Deleted

## 2022-07-11 ENCOUNTER — Telehealth: Payer: Self-pay | Admitting: *Deleted

## 2022-07-11 NOTE — Telephone Encounter (Signed)
Emgality appeal form completed. Sent patient my chart advising her she has to sign a consent for [place] to submit appeal.

## 2022-07-11 NOTE — Telephone Encounter (Signed)
Received fax from [facility] re: Emgality appeal form. Of note, it requires the patient's signature. Placed on MD desk for completion, signature.

## 2022-07-13 NOTE — Telephone Encounter (Signed)
Called patient to advise her she needs to sign consent so I can appeal Emgality. [facility]MB not set up.

## 2022-07-20 NOTE — Telephone Encounter (Signed)
Received patient's signed consent via my chart. Consent, office notes and provider appeal form faxed to [facility]. Received confirmation.

## 2022-07-20 NOTE — Telephone Encounter (Signed)
[facility] approved Emgality through 07/21/2023. Sent my chart to advise patient.

## 2022-07-24 ENCOUNTER — Other Ambulatory Visit: Payer: Self-pay

## 2022-07-24 MED ORDER — UBRELVY 100 MG PO TABS: 100.0000 mg | ORAL_TABLET | ORAL | 3 refills | Status: DC | PRN

## 2022-10-17 NOTE — Progress Notes (Unsigned)
CC:  headaches  Follow-up Visit  Last visit: 07/05/2022  Brief HPI: 47 year old female with a history of panic attacks who follows in clinic for migraines.  At her last visit, duloxetine discontinued and restarted on Emgality.  Continued on Ubrelvy for rescue.  Interval History: Headaches are about the same as her last visit. She continues to take Cymbalta daily without improvement. Roselyn Meier has been working well for rescue. It usually resolves her headache within 30 minutes. She has only had to repeat a dose 1-2 times.  Headache days per month: 30 Headache free days per month: 0  Current Headache Regimen: Preventative: Cymbalta 20 mg daily Abortive: Ubrelvy 100 mg PRN  Prior Therapies                                  Emgality - helped but insurance stopped covering it Topamax - side effects Cymbalta 20 mg daily - lack of efficacy Imitrex nasal solution Frovatriptan  Maxalt Roselyn Meier  Past Medical History:  Diagnosis Date   Adjustment disorder with anxious mood    Migraine-cluster headache syndrome    Panic attacks    Past Surgical History:  Procedure Laterality Date   BREAST LUMPECTOMY     benign   FOOT SURGERY     bunionectmy    Current Outpatient Medications:    ALPRAZolam (XANAX) 0.5 MG tablet, Take 1 tablet by mouth 2 (two) times daily as needed. (Patient not taking: Reported on 07/05/2022), Disp: , Rfl:    DULoxetine (CYMBALTA) 20 MG capsule, Take 1 capsule (20 mg total) by mouth daily., Disp: 30 capsule, Rfl: 3   Galcanezumab-gnlm (EMGALITY) 120 MG/ML SOAJ, Inject 120 mg into the skin every 30 (thirty) days., Disp: 1.12 mL, Rfl: 6   meloxicam (MOBIC) 15 MG tablet, Take 15 mg by mouth as needed. (Patient not taking: Reported on 07/05/2022), Disp: , Rfl:    methylPREDNISolone (MEDROL DOSEPAK) 4 MG TBPK tablet, Take as directed by packaging (Patient not taking: Reported on 07/05/2022), Disp: 1 each, Rfl: 0   Ubrogepant (UBRELVY) 100 MG TABS, Take 100 mg by mouth as  needed (for migraine). Can repeat a dose in 2 hours if headache persists. Max dose 2 pills in 24 hours, Disp: 16 tablet, Rfl: 3     Physical Exam:   Vital Signs: There were no vitals taken for this visit. GENERAL:  well appearing, in no acute distress, alert  SKIN:  Color, texture, turgor normal. No rashes or lesions HEAD:  Normocephalic/atraumatic. RESP: normal respiratory effort MSK:  No gross joint deformities.   NEUROLOGICAL: Mental Status: Alert, oriented to person, place and time, Follows commands, and Speech fluent and appropriate. Cranial Nerves: PERRL, face symmetric, no dysarthria, hearing grossly intact Motor: moves all extremities equally Gait: normal-based.    IMPRESSION: 47 year old female with a history of panic attacks who presents for follow up of migraines. She has failed multiple oral preventive medications at this point. Previously had success with Emgality. Will restart Emgality for migraine prevention and continue Ubrelvy for rescue.  PLAN: -Prevention: Continue Emgality 120 mg monthly -Rescue: Continue Ubrelvy 100 mg PRN     I spent *** minutes of face-to-face and non-face-to-face time with patient.  This included previsit chart review, lab review, study review, order entry, electronic health record documentation, patient education  Frann Rider, [facility]  [facility] [address] [place], [place] [address]  Phone [phone]  Fax [phone] Note: This document was prepared with digital dictation and possible smart phrase technology. Any transcriptional errors that result from this process are unintentional.

## 2022-10-18 ENCOUNTER — Encounter: Payer: Self-pay | Admitting: Adult Health

## 2022-10-18 ENCOUNTER — Ambulatory Visit (INDEPENDENT_AMBULATORY_CARE_PROVIDER_SITE_OTHER): Payer: BC Managed Care – PPO | Admitting: Adult Health

## 2022-10-18 VITALS — BP 113/69 | HR 84 | Ht 65.0 in | Wt 186.4 lb

## 2022-10-18 DIAGNOSIS — G43119 Migraine with aura, intractable, without status migrainosus: Secondary | ICD-10-CM

## 2022-10-18 MED ORDER — UBRELVY 100 MG PO TABS: 100.0000 mg | ORAL_TABLET | ORAL | 11 refills | Status: DC | PRN

## 2022-10-18 NOTE — Patient Instructions (Addendum)
Your Plan:  Continue Emgality monthly injection  Continue Ubrelvy for rescue medication    Follow up in 8 months or call earlier if needed    Thank you for coming to see [place] at [facility]. I hope we have been able to provide you high quality care today.  You may receive a patient satisfaction survey over the next few weeks. We would appreciate your feedback and comments so that we may continue to improve ourselves and the health of our patients.

## 2022-11-27 DIAGNOSIS — N921 Excessive and frequent menstruation with irregular cycle: Secondary | ICD-10-CM | POA: Diagnosis not present

## 2023-02-16 DIAGNOSIS — E559 Vitamin D deficiency, unspecified: Secondary | ICD-10-CM | POA: Diagnosis not present

## 2023-02-16 DIAGNOSIS — Z6829 Body mass index (BMI) 29.0-29.9, adult: Secondary | ICD-10-CM | POA: Diagnosis not present

## 2023-02-16 DIAGNOSIS — F458 Other somatoform disorders: Secondary | ICD-10-CM | POA: Diagnosis not present

## 2023-02-16 DIAGNOSIS — D519 Vitamin B12 deficiency anemia, unspecified: Secondary | ICD-10-CM | POA: Diagnosis not present

## 2023-02-24 ENCOUNTER — Other Ambulatory Visit: Payer: Self-pay | Admitting: Psychiatry

## 2023-02-26 NOTE — Telephone Encounter (Signed)
Pt last seen on 10/18/22 per note "-Prevention: Continue Emgality 120 mg monthly "  Follow up scheduled on 06/20/23  Last filled on 02/05/23 1ML(30 day supply)

## 2023-03-02 ENCOUNTER — Telehealth: Payer: Self-pay

## 2023-03-02 ENCOUNTER — Other Ambulatory Visit (HOSPITAL_COMMUNITY): Payer: Self-pay

## 2023-03-02 NOTE — Telephone Encounter (Signed)
Pharmacy Patient Advocate Encounter   Received notification from [facility] that prior authorization for Ubrelvy 100MG  tablets is required/requested.   PA submitted on 03/02/2023 to (ins) [employer]  via [employer] or (Medicaid) confirmation # [id number]  Status is pending

## 2023-03-02 NOTE — Telephone Encounter (Signed)
Pharmacy Patient Advocate Encounter  Prior Authorization for Ubrelvy 100MG  tablets has been approved by [employer] (ins).    PA # PA Case ID #:[id number] Effective dates: 03/02/2023 through 03/01/2024

## 2023-04-16 DIAGNOSIS — Z1231 Encounter for screening mammogram for malignant neoplasm of breast: Secondary | ICD-10-CM | POA: Diagnosis not present

## 2023-05-28 DIAGNOSIS — L814 Other melanin hyperpigmentation: Secondary | ICD-10-CM | POA: Diagnosis not present

## 2023-05-28 DIAGNOSIS — D225 Melanocytic nevi of trunk: Secondary | ICD-10-CM | POA: Diagnosis not present

## 2023-05-28 DIAGNOSIS — L578 Other skin changes due to chronic exposure to nonionizing radiation: Secondary | ICD-10-CM | POA: Diagnosis not present

## 2023-05-28 DIAGNOSIS — D485 Neoplasm of uncertain behavior of skin: Secondary | ICD-10-CM | POA: Diagnosis not present

## 2023-06-04 DIAGNOSIS — Z1231 Encounter for screening mammogram for malignant neoplasm of breast: Secondary | ICD-10-CM | POA: Diagnosis not present

## 2023-06-04 DIAGNOSIS — N921 Excessive and frequent menstruation with irregular cycle: Secondary | ICD-10-CM | POA: Diagnosis not present

## 2023-06-04 DIAGNOSIS — Z01419 Encounter for gynecological examination (general) (routine) without abnormal findings: Secondary | ICD-10-CM | POA: Diagnosis not present

## 2023-06-19 NOTE — Progress Notes (Signed)
CC:  headaches  Follow-up Visit  Last visit: 10/18/2022  Brief HPI: 47 year old Kari [name] with a history of panic attacks who follows in clinic for migraines.  At her last visit, continued on Emgality for prevention and Ubrelvy for rescue  Interval History:  Migraines have been well controlled on Emgality, last injection 9/1, usually about 1-2 migraines per month. Use of Ubrelvy with some benefit, usually will have to use 2nd dose. Has been having some increase in migraines more recently due to prolonged cycle, recently completed course of progesterone.  She did experience increased family stress in June but thankfully did not have worsening migraines.     Headache days per month: 1-2 Headache free days per month: Kari-29  Current Headache Regimen: Preventative: Emgality monthly injection Abortive: Ubrelvy 100 mg PRN  Prior Therapies                                  Emgality - helped but insurance stopped covering it Topamax - side effects Cymbalta 20 mg daily - lack of efficacy Imitrex nasal solution Frovatriptan  Maxalt Bernita Raisin    Past Medical History:  Diagnosis Date   Adjustment disorder with anxious mood    Migraine-cluster headache syndrome    Panic attacks    Past Surgical History:  Procedure Laterality Date   BREAST LUMPECTOMY     benign   FOOT SURGERY     bunionectmy    Current Outpatient Medications:    valACYclovir (VALTREX) 1000 MG tablet, Take 1,000 mg by mouth 2 (two) times daily as needed., Disp: , Rfl:    ALPRAZolam (XANAX) 0.5 MG tablet, Take 1 tablet by mouth 2 (two) times daily as needed. (Patient not taking: Reported on 06/20/2023), Disp: , Rfl:    Galcanezumab-gnlm (EMGALITY) 120 MG/ML SOAJ, Inject 120 mLs into the skin every 30 (thirty) days., Disp: 1 mL, Rfl: 11   Ubrogepant (UBRELVY) 100 MG TABS, Take 1 tablet (100 mg total) by mouth as needed (for migraine). Can repeat a dose in 2 hours if headache persists. Max dose 2 pills in 24 hours, Disp:  16 tablet, Rfl: 11     Physical Exam:   Vital Signs: BP 109/71 (BP Location: Right Arm, Patient Position: Sitting, Cuff Size: Small)   Pulse 72   Ht 5\' 6"  (1.676 m)   Wt 183 lb 6.4 oz (83.2 kg)   BMI 29.60 kg/m  GENERAL:  well appearing, in no acute distress, alert  SKIN:  Color, texture, turgor normal. No rashes or lesions HEAD:  Normocephalic/atraumatic. RESP: normal respiratory effort MSK:  No gross joint deformities.   NEUROLOGICAL: Mental Status: Alert, oriented to person, place and time, Follows commands, and Speech fluent and appropriate. Cranial Nerves: PERRL, face symmetric, no dysarthria, hearing grossly intact Motor: moves all extremities equally Gait: normal-based.     IMPRESSION: 47 year old Kari [name] with a history of panic attacks who presents for follow up of migraines. She has failed multiple oral preventive medications at this point.  Has had great success on Emgality, some worsening more recently due to prolonged cycle. Use of Ubrelvy with some benefit.     PLAN: -Prevention: Continue Emgality 120 mg monthly injection - refill provided -Rescue: provided samples for Nurtec - will call if beneficial other will continue Ubrelvy 100 mg PRN, she was advised not to take together.     Follow-up in 1 year or call earlier if needed  I spent 25 minutes of face-to-face and non-face-to-face time with patient.  This included previsit chart review, lab review, study review, order entry, electronic health record documentation, patient education and discussion regarding migraine headaches and treatment plan and answered all the questions to patient's satisfaction  Ihor Austin, [facility]  [facility] [address] [place], [place] [address]  Phone [phone] Fax [phone] Note: This document was prepared with digital dictation and possible smart phrase technology. Any transcriptional errors that result from this process are  unintentional.

## 2023-06-20 ENCOUNTER — Ambulatory Visit: Payer: BC Managed Care – PPO | Admitting: Adult Health

## 2023-06-20 ENCOUNTER — Encounter: Payer: Self-pay | Admitting: Adult Health

## 2023-06-20 VITALS — BP 109/71 | HR 72 | Ht 66.0 in | Wt 183.4 lb

## 2023-06-20 DIAGNOSIS — G43119 Migraine with aura, intractable, without status migrainosus: Secondary | ICD-10-CM | POA: Diagnosis not present

## 2023-06-20 MED ORDER — EMGALITY 120 MG/ML ~~LOC~~ SOAJ: 120.0000 mL | SUBCUTANEOUS | 11 refills | Status: DC

## 2023-06-20 MED ORDER — UBRELVY 100 MG PO TABS: 100.0000 mg | ORAL_TABLET | ORAL | 11 refills | Status: DC | PRN

## 2023-06-20 NOTE — Patient Instructions (Addendum)
Your Plan:  Continue Emgality monthly for migraine prevention  Can try Nurtec to take as needed - only take 1 tablet in a 24 hour period  Continue Ubrelvy as needed for rescue  Please do not take both Nurtec and Ubrelvy together     Follow up in 1 year or call earlier if needed     Thank you for coming to see [place] at [facility]. I hope we have been able to provide you high quality care today.  You may receive a patient satisfaction survey over the next few weeks. We would appreciate your feedback and comments so that we may continue to improve ourselves and the health of our patients.

## 2023-08-09 ENCOUNTER — Telehealth: Payer: Self-pay | Admitting: Pharmacy Technician

## 2023-08-09 ENCOUNTER — Other Ambulatory Visit (HOSPITAL_COMMUNITY): Payer: Self-pay

## 2023-08-09 NOTE — Telephone Encounter (Signed)
Pharmacy Patient Advocate Encounter   Received notification from CoverMyMeds that prior authorization for Emgality 120MG /ML auto-injectors (migraine) is required/requested.   Insurance verification completed.   The patient is insured through [employer] .   Per test claim: PA required; PA submitted to [employer] via CoverMyMeds Key/confirmation #/EOC [id number] Status is pending

## 2023-08-16 NOTE — Telephone Encounter (Signed)
Patient called in today, stated she went to go pick up [facility] and Pharmacy stated they are waiting on PA. States she usually takes her injections on the 1st so wanting an update on status

## 2023-08-17 ENCOUNTER — Other Ambulatory Visit (HOSPITAL_COMMUNITY): Payer: Self-pay

## 2023-08-17 NOTE — Telephone Encounter (Signed)
   PA is still under determination-tried to run a test claim and still states PA needed. Awaiting determination.

## 2023-08-20 ENCOUNTER — Other Ambulatory Visit (HOSPITAL_COMMUNITY): Payer: Self-pay

## 2023-08-20 NOTE — Telephone Encounter (Signed)
Pharmacy Patient Advocate Encounter  Received notification from  [employer]  that Prior Authorization for Emgality 120MG /ML auto-injectors has been APPROVED from 08/09/2023 to 08/08/2024. Ran test claim, Copay is $Unable to obtain due to refill too soon rejection, last fille don 08/19/2023, next available fill on or after 09/08/2023.. This test claim was processed through [facility]- copay amounts may vary at other pharmacies due to pharmacy/plan contracts, or as the patient moves through the different stages of their insurance plan.   PA #/Case ID/Reference #: [id number]

## 2023-10-19 DIAGNOSIS — Z6828 Body mass index (BMI) 28.0-28.9, adult: Secondary | ICD-10-CM | POA: Diagnosis not present

## 2023-10-19 DIAGNOSIS — S00419A Abrasion of unspecified ear, initial encounter: Secondary | ICD-10-CM | POA: Diagnosis not present

## 2024-02-19 ENCOUNTER — Other Ambulatory Visit (HOSPITAL_COMMUNITY): Payer: Self-pay

## 2024-02-22 ENCOUNTER — Telehealth: Payer: Self-pay

## 2024-02-22 ENCOUNTER — Other Ambulatory Visit (HOSPITAL_COMMUNITY): Payer: Self-pay

## 2024-02-22 NOTE — Telephone Encounter (Signed)
 Pharmacy Patient Advocate Encounter   Received notification from CoverMyMeds that prior authorization for Ubrelvy  100MG  tablets is required/requested.   Insurance verification completed.   The patient is insured through [employer] .   Per test claim: PA required; PA submitted to above mentioned insurance via CoverMyMeds Key/confirmation #/EOC [id number] Status is pending

## 2024-02-22 NOTE — Telephone Encounter (Signed)
 Pharmacy Patient Advocate Encounter  Received notification from [employer] that Prior Authorization for Ubrelvy  100mg  Tablets has been APPROVED from 02/22/2024 to 02/21/2025. Ran test claim, Copay is $0. This test claim was processed through [facility]- copay amounts may vary at other pharmacies due to pharmacy/plan contracts, or as the patient moves through the different stages of their insurance plan.   PA #/Case ID/Reference #: PA Case ID #: [id number]

## 2024-05-28 DIAGNOSIS — L578 Other skin changes due to chronic exposure to nonionizing radiation: Secondary | ICD-10-CM | POA: Diagnosis not present

## 2024-05-28 DIAGNOSIS — L821 Other seborrheic keratosis: Secondary | ICD-10-CM | POA: Diagnosis not present

## 2024-05-28 DIAGNOSIS — D485 Neoplasm of uncertain behavior of skin: Secondary | ICD-10-CM | POA: Diagnosis not present

## 2024-05-28 DIAGNOSIS — L814 Other melanin hyperpigmentation: Secondary | ICD-10-CM | POA: Diagnosis not present

## 2024-06-09 DIAGNOSIS — N946 Dysmenorrhea, unspecified: Secondary | ICD-10-CM | POA: Diagnosis not present

## 2024-06-09 DIAGNOSIS — N921 Excessive and frequent menstruation with irregular cycle: Secondary | ICD-10-CM | POA: Diagnosis not present

## 2024-06-09 DIAGNOSIS — N938 Other specified abnormal uterine and vaginal bleeding: Secondary | ICD-10-CM | POA: Diagnosis not present

## 2024-06-18 NOTE — Progress Notes (Unsigned)
 CC:  headaches  Follow-up Visit  Last visit: 06/20/2023  Brief HPI: 48 year old female with a history of panic attacks who follows in clinic for migraines.  At her last visit, continued on Emgality  for prevention and Ubrelvy  for rescue. Was provided sample for Nurtec.   Interval History:   Migraines remain well-controlled with monthly Emgality .  She reports having about 3-4 migraines per month.  Continues with Ubrelvy  but does not feel as beneficial as it used to.  She was previously provided with sample of Nurtec and recently just tried but waited for a couple hours after migraine started and did not see any significant benefit.  She will occasionally use ibuprofen /acetaminophen  combo for more milder headaches but does not overuse.  She continues to have issues with irregular menstrual cycle and prolonged cycle leading to anemia, currently working with her OB/GYN to determine further causes.       Headache days per month: 3-4   Current Headache Regimen: Preventative: Emgality  monthly injection Abortive: Ubrelvy  100 mg PRN  Prior Therapies                                  Emgality  - helped but insurance stopped covering it Topamax - side effects Cymbalta  20 mg daily - lack of efficacy Imitrex  Frovatriptan  Maxalt Ubrelvy     ROS:   14 system review of systems performed and negative with exception of those listed in HPI   Past Medical History:  Diagnosis Date   Adjustment disorder with anxious mood    Migraine-cluster headache syndrome    Panic attacks    Past Surgical History:  Procedure Laterality Date   BREAST LUMPECTOMY     benign   FOOT SURGERY     bunionectmy    Current Outpatient Medications:    norethindrone (MICRONOR) 0.35 MG tablet, Take 1 tablet by mouth daily., Disp: , Rfl:    valACYclovir (VALTREX) 1000 MG tablet, Take 1,000 mg by mouth 2 (two) times daily as needed., Disp: , Rfl:    Galcanezumab -gnlm (EMGALITY ) 120 MG/ML SOAJ, Inject 120 mLs  into the skin every 30 (thirty) days., Disp: 1 mL, Rfl: 11   Ubrogepant  (UBRELVY ) 100 MG TABS, Take 1 tablet (100 mg total) by mouth as needed (for migraine). Can repeat a dose in 2 hours if headache persists. Max dose 2 pills in 24 hours, Disp: 16 tablet, Rfl: 11     Physical Exam:   Vital Signs: BP 117/78   Pulse 78   Ht 5' 6 (1.676 m)   Wt 186 lb (84.4 kg)   BMI 30.02 kg/m  GENERAL:  well appearing, in no acute distress, alert  SKIN:  Color, texture, turgor normal. No rashes or lesions HEAD:  Normocephalic/atraumatic. RESP: normal respiratory effort MSK:  No gross joint deformities.   NEUROLOGICAL: Mental Status: Alert, oriented to person, place and time, Follows commands, and Speech fluent and appropriate. Cranial Nerves: PERRL, face symmetric, no dysarthria, hearing grossly intact Motor: moves all extremities equally Gait: normal-based.     IMPRESSION: 48 year old female with a history of panic attacks who presents for follow up of migraines. She has failed multiple oral preventive medications at this point.  Migraines remain well-controlled with Emgality , intermittent benefit with Ubrelvy .     PLAN: -Prevention: Continue Emgality  120 mg monthly injection - refill provided -Rescue: provided samples for Nurtec - will call if beneficial, advised to take  at onset of migraine aura for greatest benefit. If does not work, was also provided sample for Zavzpret nasal spray.  She is aware not to use these rescue medications together. If no benefit with Ubrelvy  or Zavzpret, she will continue on Ubrelvy .       Follow-up in 1 year or call earlier if needed     I personally spent a total of 25 minutes in the care of the patient today including preparing to see the patient, performing a medically appropriate exam/evaluation, counseling and educating, placing orders, and documenting clinical information in the EHR.   Harlene Bogaert, AGNP-BC  [facility] [address] [place], [place] [address]  Phone [phone] Fax [phone] Note: This document was prepared with digital dictation and possible smart phrase technology. Any transcriptional errors that result from this process are unintentional.

## 2024-06-19 ENCOUNTER — Encounter: Payer: Self-pay | Admitting: Adult Health

## 2024-06-19 ENCOUNTER — Ambulatory Visit: Payer: BC Managed Care – PPO | Admitting: Adult Health

## 2024-06-19 DIAGNOSIS — G43119 Migraine with aura, intractable, without status migrainosus: Secondary | ICD-10-CM

## 2024-06-19 MED ORDER — EMGALITY 120 MG/ML ~~LOC~~ SOAJ: 120.0000 mL | SUBCUTANEOUS | 11 refills | Status: AC

## 2024-06-19 MED ORDER — UBRELVY 100 MG PO TABS: 100.0000 mg | ORAL_TABLET | ORAL | 11 refills | Status: DC | PRN

## 2024-06-19 NOTE — Patient Instructions (Addendum)
 Your Plan:  Continue Emgality  monthly injection  Try Nurtec, do not take more than 1 in a 24 hour period  - let me know if helpful   Please let me know if your migraines should worsen       Follow up in 1 year or call earlier if needed       Thank you for coming to see us  at [facility]. I hope we have been able to provide you high quality care today.  You may receive a patient satisfaction survey over the next few weeks. We would appreciate your feedback and comments so that we may continue to improve ourselves and the health of our patients.

## 2024-06-26 DIAGNOSIS — Z1231 Encounter for screening mammogram for malignant neoplasm of breast: Secondary | ICD-10-CM | POA: Diagnosis not present

## 2024-06-26 DIAGNOSIS — R6889 Other general symptoms and signs: Secondary | ICD-10-CM | POA: Diagnosis not present

## 2024-06-26 DIAGNOSIS — N921 Excessive and frequent menstruation with irregular cycle: Secondary | ICD-10-CM | POA: Diagnosis not present

## 2024-06-26 DIAGNOSIS — N946 Dysmenorrhea, unspecified: Secondary | ICD-10-CM | POA: Diagnosis not present

## 2024-06-26 DIAGNOSIS — N938 Other specified abnormal uterine and vaginal bleeding: Secondary | ICD-10-CM | POA: Diagnosis not present

## 2024-06-26 DIAGNOSIS — N9489 Other specified conditions associated with female genital organs and menstrual cycle: Secondary | ICD-10-CM | POA: Diagnosis not present

## 2024-06-27 ENCOUNTER — Other Ambulatory Visit: Payer: Self-pay | Admitting: Adult Health

## 2024-06-27 DIAGNOSIS — G43119 Migraine with aura, intractable, without status migrainosus: Secondary | ICD-10-CM

## 2024-07-15 DIAGNOSIS — C541 Malignant neoplasm of endometrium: Secondary | ICD-10-CM | POA: Diagnosis not present

## 2024-07-21 DIAGNOSIS — Z79624 Long term (current) use of inhibitors of nucleotide synthesis: Secondary | ICD-10-CM | POA: Diagnosis not present

## 2024-07-21 DIAGNOSIS — Z888 Allergy status to other drugs, medicaments and biological substances status: Secondary | ICD-10-CM | POA: Diagnosis not present

## 2024-07-21 DIAGNOSIS — Z170 Estrogen receptor positive status [ER+]: Secondary | ICD-10-CM | POA: Diagnosis not present

## 2024-07-21 DIAGNOSIS — G43839 Menstrual migraine, intractable, without status migrainosus: Secondary | ICD-10-CM | POA: Diagnosis not present

## 2024-07-21 DIAGNOSIS — Z87440 Personal history of urinary (tract) infections: Secondary | ICD-10-CM | POA: Diagnosis not present

## 2024-07-21 DIAGNOSIS — C541 Malignant neoplasm of endometrium: Secondary | ICD-10-CM | POA: Diagnosis not present

## 2024-07-21 DIAGNOSIS — Z793 Long term (current) use of hormonal contraceptives: Secondary | ICD-10-CM | POA: Diagnosis not present

## 2024-08-05 DIAGNOSIS — C541 Malignant neoplasm of endometrium: Secondary | ICD-10-CM | POA: Diagnosis not present

## 2024-08-07 DIAGNOSIS — C541 Malignant neoplasm of endometrium: Secondary | ICD-10-CM | POA: Diagnosis not present

## 2024-08-18 DIAGNOSIS — Z1231 Encounter for screening mammogram for malignant neoplasm of breast: Secondary | ICD-10-CM | POA: Diagnosis not present

## 2024-08-19 DIAGNOSIS — Z1231 Encounter for screening mammogram for malignant neoplasm of breast: Secondary | ICD-10-CM | POA: Diagnosis not present

## 2024-08-19 DIAGNOSIS — C541 Malignant neoplasm of endometrium: Secondary | ICD-10-CM | POA: Diagnosis not present

## 2024-09-04 ENCOUNTER — Telehealth: Payer: Self-pay

## 2024-09-04 ENCOUNTER — Other Ambulatory Visit (HOSPITAL_COMMUNITY): Payer: Self-pay

## 2024-09-04 NOTE — Telephone Encounter (Signed)
   Came across this with the incorrect direction while working on [place].

## 2024-09-04 NOTE — Telephone Encounter (Signed)
 Pharmacy Patient Advocate Encounter   Received notification from CoverMyMeds that prior authorization for Emgality  is required/requested.   Insurance verification completed.   The patient is insured through [employer].   Per test claim: PA required; PA submitted to above mentioned insurance via [employer] Key/confirmation #/EOC [id number] Status is pending

## 2024-09-09 ENCOUNTER — Other Ambulatory Visit (HOSPITAL_COMMUNITY): Payer: Self-pay

## 2024-09-09 NOTE — Telephone Encounter (Signed)
 Pharmacy Patient Advocate Encounter  Received notification from [facility] that Prior Authorization for Emgality  has been APPROVED from 09/04/2024 to 09/04/2025. Unable to obtain price due to refill too soon rejection, last fill date 06/19/2024 next available fill date11/29/2025   PA #/Case ID/Reference #: [id number]

## 2024-11-20 ENCOUNTER — Encounter: Payer: Self-pay | Admitting: Adult Health

## 2024-11-20 DIAGNOSIS — G43119 Migraine with aura, intractable, without status migrainosus: Secondary | ICD-10-CM

## 2024-11-20 MED ORDER — NURTEC 75 MG PO TBDP: 75.0000 mg | ORAL_TABLET | ORAL | 11 refills | Status: AC | PRN

## 2024-11-20 NOTE — Telephone Encounter (Signed)
 Patient stated that Nurtec was helpful and was requesting a prescription for the medication.

## 2025-06-25 ENCOUNTER — Ambulatory Visit: Admitting: Adult Health
